# Patient Record
Sex: Male | Born: 1975
Health system: Southern US, Community
[De-identification: ages and names within clinical notes are randomized; demographics above are authoritative.]

---

## 2000-07-10 ENCOUNTER — Emergency Department (HOSPITAL_COMMUNITY): Admission: EM | Admit: 2000-07-10 | Discharge: 2000-07-10 | Payer: Self-pay | Admitting: Emergency Medicine

## 2001-07-04 ENCOUNTER — Encounter: Admission: RE | Admit: 2001-07-04 | Discharge: 2001-07-04 | Payer: Self-pay | Admitting: Family Medicine

## 2014-06-30 ENCOUNTER — Emergency Department (HOSPITAL_BASED_OUTPATIENT_CLINIC_OR_DEPARTMENT_OTHER)
Admission: EM | Admit: 2014-06-30 | Discharge: 2014-06-30 | Disposition: A | Discharge status: Home / Self Care | Payer: BLUE CROSS/BLUE SHIELD | Attending: Emergency Medicine | Admitting: Emergency Medicine

## 2014-06-30 ENCOUNTER — Emergency Department (HOSPITAL_BASED_OUTPATIENT_CLINIC_OR_DEPARTMENT_OTHER): Payer: BLUE CROSS/BLUE SHIELD

## 2014-06-30 ENCOUNTER — Encounter (HOSPITAL_BASED_OUTPATIENT_CLINIC_OR_DEPARTMENT_OTHER): Payer: Self-pay | Admitting: *Deleted

## 2014-06-30 DIAGNOSIS — R109 Unspecified abdominal pain: Secondary | ICD-10-CM

## 2014-06-30 DIAGNOSIS — R197 Diarrhea, unspecified: Secondary | ICD-10-CM | POA: Insufficient documentation

## 2014-06-30 DIAGNOSIS — N23 Unspecified renal colic: Secondary | ICD-10-CM | POA: Insufficient documentation

## 2014-06-30 DIAGNOSIS — N201 Calculus of ureter: Secondary | ICD-10-CM | POA: Insufficient documentation

## 2014-06-30 DIAGNOSIS — R1032 Left lower quadrant pain: Secondary | ICD-10-CM

## 2014-06-30 LAB — COMPREHENSIVE METABOLIC PANEL
ALBUMIN: 4.8 g/dL (ref 3.5–5.2)
ALT: 54 U/L — ABNORMAL HIGH (ref 0–53)
ANION GAP: 6 (ref 5–15)
AST: 43 U/L — AB (ref 0–37)
Alkaline Phosphatase: 69 U/L (ref 39–117)
BUN: 15 mg/dL (ref 6–23)
CALCIUM: 9.1 mg/dL (ref 8.4–10.5)
CO2: 27 mmol/L (ref 19–32)
CREATININE: 1.26 mg/dL (ref 0.50–1.35)
Chloride: 102 mmol/L (ref 96–112)
GFR calc Af Amer: 82 mL/min — ABNORMAL LOW (ref >90)
GFR calc non Af Amer: 71 mL/min — ABNORMAL LOW (ref >90)
Glucose, Bld: 109 mg/dL — ABNORMAL HIGH (ref 70–99)
Potassium: 3.4 mmol/L — ABNORMAL LOW (ref 3.5–5.1)
Sodium: 135 mmol/L (ref 135–145)
Total Bilirubin: 2.4 mg/dL — ABNORMAL HIGH (ref 0.3–1.2)
Total Protein: 8.5 g/dL — ABNORMAL HIGH (ref 6.0–8.3)

## 2014-06-30 LAB — CBC
HCT: 47.6 % (ref 39.0–52.0)
Hemoglobin: 15.6 g/dL (ref 13.0–17.0)
MCH: 30 pg (ref 26.0–34.0)
MCHC: 32.8 g/dL (ref 30.0–36.0)
MCV: 91.5 fL (ref 78.0–100.0)
PLATELETS: 197 10*3/uL (ref 150–400)
RBC: 5.2 MIL/uL (ref 4.22–5.81)
RDW: 13 % (ref 11.5–15.5)
WBC: 5 10*3/uL (ref 4.0–10.5)

## 2014-06-30 LAB — LIPASE, BLOOD: Lipase: 24 U/L (ref 11–59)

## 2014-06-30 MED ORDER — FENTANYL CITRATE 0.05 MG/ML IJ SOLN
50.0000 ug | INTRAMUSCULAR | Status: DC | PRN
  Administered 2014-06-30: 50 ug via INTRAVENOUS
  Filled 2014-06-30: qty 2

## 2014-06-30 MED ORDER — ONDANSETRON 4 MG PO TBDP: ORAL_TABLET | ORAL | Status: DC

## 2014-06-30 MED ORDER — ONDANSETRON HCL 4 MG/2ML IJ SOLN
4.0000 mg | Freq: Once | INTRAMUSCULAR | Status: AC
  Administered 2014-06-30: 4 mg via INTRAVENOUS
  Filled 2014-06-30: qty 2

## 2014-06-30 MED ORDER — OXYCODONE-ACETAMINOPHEN 5-325 MG PO TABS
2.0000 | ORAL_TABLET | Freq: Once | ORAL | Status: AC
  Administered 2014-06-30: 2 via ORAL
  Filled 2014-06-30: qty 2

## 2014-06-30 MED ORDER — OXYCODONE-ACETAMINOPHEN 5-325 MG PO TABS: 1.0000 | ORAL_TABLET | ORAL | Status: DC | PRN

## 2014-06-30 MED ORDER — KETOROLAC TROMETHAMINE 30 MG/ML IJ SOLN
30.0000 mg | Freq: Once | INTRAMUSCULAR | Status: AC
  Administered 2014-06-30: 30 mg via INTRAVENOUS
  Filled 2014-06-30: qty 1

## 2014-06-30 MED ORDER — FENTANYL CITRATE 0.05 MG/ML IJ SOLN: 50.0000 ug | Freq: Once | INTRAMUSCULAR | Status: DC

## 2014-06-30 MED ORDER — FENTANYL CITRATE 0.05 MG/ML IJ SOLN
50.0000 ug | Freq: Once | INTRAMUSCULAR | Status: AC
  Administered 2014-06-30: 50 ug via INTRAVENOUS
  Filled 2014-06-30: qty 2

## 2014-06-30 NOTE — ED Provider Notes (Signed)
CSN: [id number]     Arrival date & time 06/30/14  1249 History   First MD Initiated Contact with Patient 06/30/14 1401     Chief Complaint  Patient presents with  . Flank Pain     (Consider location/radiation/quality/duration/timing/severity/associated sxs/prior Treatment) The history is provided by the patient and medical records. No language interpreter was used.      Kord Monette is a 39 y.o. male  with a hx of ankylosing spondylitis presents to the Emergency Department complaining of gradual, persistent, progressively worsening left flank and left side abd pain onset 8:30am.  Pt reports that he had the a GI virus with Nausea, vomiting and diarrhea for 24 hours with improvement and no vomiting or diarrhea in the last 24 hours.  He reports he arose to get ready for work and the pain began in the shower.  Pt reports his urine is dark but he denies dysuria.  Nothing makes the pain better or worse. Patient denies fever, chills, headache, neck pain, chest pain, shortness of breath, venous, dizziness, syncope, dysuria. Patient also denies testicular pain and penile pain.   History reviewed. No pertinent past medical history. History reviewed. No pertinent past surgical history. No family history on file. History  Substance Use Topics  . Smoking status: Never Smoker   . Smokeless tobacco: Not on file  . Alcohol Use: No    Review of Systems  Constitutional: Negative for fever, diaphoresis, appetite change, fatigue and unexpected weight change.  HENT: Negative for mouth sores.   Eyes: Negative for visual disturbance.  Respiratory: Negative for cough, chest tightness, shortness of breath and wheezing.   Cardiovascular: Negative for chest pain.  Gastrointestinal: Positive for vomiting (resolved), abdominal pain (LLQ abd pain) and diarrhea (resolved). Negative for nausea and constipation.  Endocrine: Negative for polydipsia, polyphagia and polyuria.  Genitourinary: Positive for flank  pain. Negative for dysuria, urgency, frequency and hematuria.  Musculoskeletal: Negative for back pain and neck stiffness.  Skin: Negative for rash.  Allergic/Immunologic: Negative for immunocompromised state.  Neurological: Negative for syncope, light-headedness and headaches.  Hematological: Does not bruise/bleed easily.  Psychiatric/Behavioral: Negative for sleep disturbance. The patient is not nervous/anxious.       Allergies  Review of patient's allergies indicates no known allergies.  Home Medications   Prior to Admission medications   Medication Sig Start Date End Date Taking? Authorizing Provider  ondansetron (ZOFRAN ODT) 4 MG disintegrating tablet 42m ODT q4 hours prn nausea/vomit 06/30/14   Wynne Jury, PA-C  oxyCODONE-acetaminophen (PERCOCET) 5-325 MG per tablet Take 1-2 tablets by mouth every 4 (four) hours as needed. 06/30/14   Jaydyn Bozzo, PA-C   BP 113/66 mmHg  Pulse 68  Temp(Src) 98.5 F (36.9 C) (Oral)  Resp 16  Ht 5' 11"  (1.803 m)  Wt 186 lb (84.369 kg)  BMI 25.95 kg/m2  SpO2 100% Physical Exam  Constitutional: He appears well-developed and well-nourished. No distress.  HENT:  Head: Normocephalic and atraumatic.  Mouth/Throat: Oropharynx is clear and moist. No oropharyngeal exudate.  Cardiovascular: Normal rate, regular rhythm, normal heart sounds and intact distal pulses.   Pulmonary/Chest: Effort normal and breath sounds normal. No respiratory distress. He has no wheezes.  Abdominal: Soft. Bowel sounds are normal. He exhibits no distension and no mass. There is tenderness (LLQ and left side) in the left lower quadrant. There is no rebound, no guarding and no CVA tenderness. Hernia confirmed negative in the right inguinal area and confirmed negative in the left inguinal area.  Mild tenderness to palpation of the left side and left lower quadrant without guarding or rebound No CVA tenderness  Genitourinary: Testes normal and penis normal.  Cremasteric reflex is present. Right testis shows no mass, no swelling and no tenderness. Right testis is descended. Cremasteric reflex is not absent on the right side. Left testis shows no mass, no swelling and no tenderness. Left testis is descended. Cremasteric reflex is not absent on the left side. Circumcised. No phimosis, paraphimosis, hypospadias, penile erythema or penile tenderness. No discharge found.  Musculoskeletal: Normal range of motion. He exhibits no edema.  Lymphadenopathy:       Right: No inguinal adenopathy present.       Left: No inguinal adenopathy present.  Neurological: He is alert.  Skin: Skin is warm and dry. No rash noted. He is not diaphoretic.  Psychiatric: He has a normal mood and affect.  Nursing note and vitals reviewed.   ED Course  Procedures (including critical care time) Labs Review Labs Reviewed  COMPREHENSIVE METABOLIC PANEL - Abnormal; Notable for the following:    Potassium 3.4 (*)    Glucose, Bld 109 (*)    Total Protein 8.5 (*)    AST 43 (*)    ALT 54 (*)    Total Bilirubin 2.4 (*)    GFR calc non Af Amer 71 (*)    GFR calc Af Amer 82 (*)    All other components within normal limits  CBC  LIPASE, BLOOD    Imaging Review Ct Renal Stone Study  06/30/2014   CLINICAL DATA:  Left flank pain since yesterday.  EXAM: CT ABDOMEN AND PELVIS WITHOUT CONTRAST  TECHNIQUE: Multidetector CT imaging of the abdomen and pelvis was performed following the standard protocol without IV contrast.  COMPARISON:  None.  FINDINGS: Lung bases are clear.  No effusions.  Heart is normal size.  3 mm stone noted at the left UPJ with from mild left hydronephrosis. No additional ureteral stones or renal stones. No hydronephrosis on the right.  Liver, gallbladder, spleen, pancreas and adrenals have an unremarkable unenhanced appearance.  Appendix is visualized and is normal. Stomach, large and small bowel unremarkable. Urinary bladder and prostate unremarkable. No free fluid,  free air or adenopathy. Aorta is normal caliber.  No acute bony abnormality or focal bone lesion.  IMPRESSION: 3 mm left UPJ stone.  Mild left hydronephrosis.   Electronically Signed   By: Rolm Baptise M.D.   On: 06/30/2014 15:24     EKG Interpretation None      MDM   Final diagnoses:  Left flank pain  LLQ abdominal pain  Left ureteral stone  Renal colic on left side   Hasheem Voland presents to emergency department with left flank and left lower quadrant abdominal pain. Patient with recent viral gastroenteritis however based on patient's symptoms and physical exam I doubt that this is a complication of the gastroenteritis and more likely to be renal colic. Patient's pain well controlled with Tylenol. Will obtain CT renal study and labs.   4:24pm CT with 3 mm left UPJ stone with mild left hydronephrosis consistent with patient's symptoms. His labs are reassuring. Mild elevation in AST and ALTs with a normal lipase. No upper abdominal pain. No Murphy's sign and no right upper quadrant tenderness.  Patient with recent viral gastroenteritis likely the cause of his elevated AST and ALTs. Otherwise labs reassuring. Patient is resting comfortably this time. He reports his pain is under control and wishes for discharge home.  Discussed potential complications of kidney stones as patient has no history of same. He will return to the emergency department for fevers, chills, intractable vomiting or intractable pain. He is to follow-up with urology as needed.  I have personally reviewed patient's vitals, nursing note and any pertinent labs or imaging.  I performed an focused physical exam; undressed when appropriate .    It has been determined that no acute conditions requiring further emergency intervention are present at this time. The patient/guardian have been advised of the diagnosis and plan. I reviewed any labs and imaging including any potential incidental findings. We have discussed signs and  symptoms that warrant return to the ED and they are listed in the discharge instructions.    Vital signs are stable at discharge.   BP 113/66 mmHg  Pulse 68  Temp(Src) 98.5 F (36.9 C) (Oral)  Resp 16  Ht 5' 11"  (1.803 m)  Wt 186 lb (84.369 kg)  BMI 25.95 kg/m2  SpO2 100%        Abigail Butts, PA-C 06/30/14 [place], MD 07/01/14 [phone]

## 2014-06-30 NOTE — Discharge Instructions (Signed)
1. Medications: percocet, zofran, usual home medications 2. Treatment: rest, drink plenty of fluids, you may take 854m of ibuprofen with food up to 3 times per day with the percocet, but no extra tylenol 3. Follow Up: Please followup with your primary doctor in 1 week for discussion of your diagnoses and further evaluation after today's visit; if you do not have a primary care doctor use the resource guide provided to find one; Please return to the ER for intractable vomiting, persistent pain or other concerns. Please also follow-up with the urologist listed.  Kidney Stones Kidney stones (urolithiasis) are deposits that form inside your kidneys. The intense pain is caused by the stone moving through the urinary tract. When the stone moves, the ureter goes into spasm around the stone. The stone is usually passed in the urine.  CAUSES   A disorder that makes certain neck glands produce too much parathyroid hormone (primary hyperparathyroidism).  A buildup of uric acid crystals, similar to gout in your joints.  Narrowing (stricture) of the ureter.  A kidney obstruction present at birth (congenital obstruction).  Previous surgery on the kidney or ureters.  Numerous kidney infections. SYMPTOMS   Feeling sick to your stomach (nauseous).  Throwing up (vomiting).  Blood in the urine (hematuria).  Pain that usually spreads (radiates) to the groin.  Frequency or urgency of urination. DIAGNOSIS   Taking a history and physical exam.  Blood or urine tests.  CT scan.  Occasionally, an examination of the inside of the urinary bladder (cystoscopy) is performed. TREATMENT   Observation.  Increasing your fluid intake.  Extracorporeal shock wave lithotripsy--This is a noninvasive procedure that uses shock waves to break up kidney stones.  Surgery may be needed if you have severe pain or persistent obstruction. There are various surgical procedures. Most of the procedures are performed  with the use of small instruments. Only small incisions are needed to accommodate these instruments, so recovery time is minimized. The size, location, and chemical composition are all important variables that will determine the proper choice of action for you. Talk to your health care provider to better understand your situation so that you will minimize the risk of injury to yourself and your kidney.  HOME CARE INSTRUCTIONS   Drink enough water and fluids to keep your urine clear or pale yellow. This will help you to pass the stone or stone fragments.  Strain all urine through the provided strainer. Keep all particulate matter and stones for your health care provider to see. The stone causing the pain may be as small as a grain of salt. It is very important to use the strainer each and every time you pass your urine. The collection of your stone will allow your health care provider to analyze it and verify that a stone has actually passed. The stone analysis will often identify what you can do to reduce the incidence of recurrences.  Only take over-the-counter or prescription medicines for pain, discomfort, or fever as directed by your health care provider.  Make a follow-up appointment with your health care provider as directed.  Get follow-up X-rays if required. The absence of pain does not always mean that the stone has passed. It may have only stopped moving. If the urine remains completely obstructed, it can cause loss of kidney function or even complete destruction of the kidney. It is your responsibility to make sure X-rays and follow-ups are completed. Ultrasounds of the kidney can show blockages and the status of the  kidney. Ultrasounds are not associated with any radiation and can be performed easily in a matter of minutes. SEEK MEDICAL CARE IF:  You experience pain that is progressive and unresponsive to any pain medicine you have been prescribed. SEEK IMMEDIATE MEDICAL CARE IF:   Pain  cannot be controlled with the prescribed medicine.  You have a fever or shaking chills.  The severity or intensity of pain increases over 18 hours and is not relieved by pain medicine.  You develop a new onset of abdominal pain.  You feel faint or pass out.  You are unable to urinate. MAKE SURE YOU:   Understand these instructions.  Will watch your condition.  Will get help right away if you are not doing well or get worse. Document Released: 05/07/2005 Document Revised: 01/07/2013 Document Reviewed: 10/08/2012 [facility] Patient Information 2015 [place], [place]. This information is not intended to replace advice given to you by your health care provider. Make sure you discuss any questions you have with your health care provider.    Emergency Department Resource Guide 1) Find a Doctor and Pay Out of Pocket Although you won't have to find out who is covered by your insurance plan, it is a good idea to ask around and get recommendations. You will then need to call the office and see if the doctor you have chosen will accept you as a new patient and what types of options they offer for patients who are self-pay. Some doctors offer discounts or will set up payment plans for their patients who do not have insurance, but you will need to ask so you aren't surprised when you get to your appointment.  2) Contact Your Local Health Department Not all health departments have doctors that can see patients for sick visits, but many do, so it is worth a call to see if yours does. If you don't know where your local health department is, you can check in your phone book. The CDC also has a tool to help you locate your state's health department, and many state websites also have listings of all of their local health departments.  3) Find a [facility] If your illness is not likely to be very severe or complicated, you may want to try a walk in clinic. These are popping up all over the country in  pharmacies, drugstores, and shopping centers. They're usually staffed by nurse practitioners or physician assistants that have been trained to treat common illnesses and complaints. They're usually fairly quick and inexpensive. However, if you have serious medical issues or chronic medical problems, these are probably not your best option.  No Primary Care Doctor: - Call Health Connect at  [phone] - they can help you locate a primary care doctor that  accepts your insurance, provides certain services, etc. - Physician Referral Service- [phone]  Chronic Pain Problems: Organization         Address  Phone   Notes  [facility]  [phone] Patients need to be referred by their primary care doctor.   Medication Assistance: Organization         Address  Phone   Notes  [facility] Medication [facility] [address] [place], [place] [address] [phone] --Must be a resident of [facility] -- Must have NO insurance coverage whatsoever (no Medicaid/ Medicare, etc.) -- The pt. MUST have a primary care doctor that directs their care regularly and follows them in the community   MedAssist  (  [phone]   [employer]  [phone]    Agencies that provide inexpensive medical care: Organization         Address  Phone   Notes  [place]  [phone]   [facility]    [phone]   [facility] [place], [place] [address] [phone]   [place] [address] [place] [phone]   Planned Parenthood    [phone]   [facility]    [phone]   [place] and [address], [place] Phone:  [phone], Fax:  [phone] Hours of Operation:  9 am - 6 pm, M-F.  Also accepts Medicaid/Medicare and self-pay.  [facility] for [place] [address],  [place] Phone: [phone], Fax: [phone]. Hours of Operation:  8:30 am - 5:30 pm, M-F.  Also accepts Medicaid and self-pay.  [facility] [address], [place] Phone: [phone]   [place], [place], [place] [phone], Ext. [phone] Mondays & Thursdays: 7-9 AM.  First 15 patients are seen on a first come, first serve basis.    [place] Providers:  Organization         Address  Phone   Notes  [facility] [address], [place] [phone] Also accepts self-pay patients.  [facility] [address], [place]  [phone]   [place], [address], [place] [phone]   [facility] [address], [place] [phone]   Lucianne Lei [address], [place]   [phone] Only accepts [place] Access [place] patients after they have their name applied to their card.   Self-Pay (no insurance) in [facility]:  Organization         Address  Phone   Notes  Sickle Cell Patients, [facility] Internal Medicine [place] [phone]   [facility] [place] [phone]   [facility] [place]  [place], [place], [place] [phone]   [facility]/Dr. Osei-Bonsu  [address], [place] or [address], [place] [phone] Phone number for both [place] and [place] locations is the same.  [facility]gent [facility] and [facility] [address], [place] [phone]   [facility] [address], [place] or [address] [phone]   [facility] [address], [place] [phone], phone; [phone], fax Sees patients 1st and 3rd Saturday of every month.  Must not  qualify for public or private insurance (i.e. Medicaid, Medicare, [place] Health Choice, Veterans' Benefits)  Household income should be no more than 200% of the poverty level The clinic cannot treat you if you are pregnant or think you are pregnant  Sexually transmitted diseases are not treated at the clinic.    Dental Care: Organization         Address  Phone  Notes  [facility] Department of [facility] [place] [phone] Accepts children up to age 82 who are enrolled in [place] or [place]; pregnant women  with a Medicaid card; and children who have applied for Medicaid or [place] Health Choice, but were declined, whose parents can pay a reduced fee at time of service.  [facility] Department of [facility]  [address], [place] [phone] Accepts children up to age 56 who are enrolled in [place] or [place]; pregnant women with a Medicaid card; and children who have applied for Medicaid or [place] Health Choice, but were declined, whose parents can pay a reduced fee at time of service.  [place] Adult Dental Access PROGRAM  [place] [phone] Patients are seen by appointment only. Walk-ins are not accepted. [place] will see patients 81 years of age and older. Monday - Tuesday (8am-5pm) Most Wednesdays (8:30-5pm) $30 per visit, cash only  [facility] Adult Dental Access PROGRAM  [address], [facility] [phone] Patients are seen by appointment only. Walk-ins are not accepted. [place] will see patients 63 years of age and older. One Wednesday Evening (Monthly: Volunteer Based).  $30 per visit, cash only  [place]  [phone] for adults; Children under age 50, call Graduate Pediatric Dentistry at [phone]. Children aged 36-14, please call [phone] to request a pediatric application.  Dental services are provided  in all areas of dental care including fillings, crowns and bridges, complete and partial dentures, implants, gum treatment, root canals, and extractions. Preventive care is also provided. Treatment is provided to both adults and children. Patients are selected via a lottery and there is often a waiting list.   [facility] [address], [place]  [phone] [email or web address]   [facility] [address] [place], [place] [phone], Ext. [phone] Second and Fourth Thursday of each month, opens at 6:30 AM; Clinic ends at 9 AM.  Patients are seen on a first-come first-served basis, and a limited number are seen during each clinic.   [facility]  [address] [place] D[place]er [place], [place] [phone]   Eligibility Requirements You must have lived in [place], [place], or [place] counties for at least the last three months.   You cannot be eligible for state or federal sponsored [employer], including [employer], [place], or [employer].   You generally cannot be eligible for healthcare insurance through your employer.    How to apply: Eligibility screenings are held every Tuesday and Wednesday afternoon from 1:00 pm until 4:00 pm. You do not need an appointment for the interview!  [facility] [address], [place], [place]   [place] Department  [place]  [phone]    Behavioral Health Resources in the Community: Intensive Outpatient Programs Organization         Address  Phone  Notes  [place] Edgar. [address], [place], [place] [phone]   [facility] Outpatient [address], [place], [place]   ADS: Alcohol & Drug S[facility]cs [address] [place], [place]   [place] [address],  [place], [place] or [phone]   Substance Abuse Resources Organization         Address  Phone  Notes  Alcohol and Drug Services  [phone]   [facility]  [phone]   [facility]   [facility]  [phone]   Residential & Outpatient Substance Abuse Program  [phone]   Psychological Services Organization         Address  Phone  Notes  [facility] [place]  [place]  [phone]   [place] [address], [place] or [phone]    Mobile Crisis Teams Organization         Address  Phone  Notes  [facility], Mobile Crisis Care Unit  [phone]   [facility]  [address] [place], [place]   [place] [address] [place] [phone]    Self-Help/Support Groups Organization         Address  Phone             Notes  [place]. of [place] - variety of support groups  [place] Call for more information  Narcotics Anonymous (NA), [facility] [address], [employer] [place]  2 meetings at this location   Special educational needs teacher         Address  Phone  Notes  [facility] Residential Treatment [place],    [place]  [phone]   [facility]  [address] [place] [phone], [place], [place]   [place], [place] [phone] Admissions: 8am-3pm M-F  Incentives Substance [place] [address],    [place], [place] [phone]   [facility] [address] [place], [place], [place]   [facility] (2-Rh) [address],  [place], [place]   [facility] Programs - Intensive Outpatient [address] Kristeen [address], [place], [place]   [facility] (Rew.) [place].,  [place], [place] [phone] or  [phone]   [facility]idential Treatment Services (RTS) [address] [place], [place] Accepts Medicaid  [facility] [place] [address]  [place] [phone] Substance Abuse/Addiction Treatment   [facility] Organization         Address  Phone  Notes  [facility]  [phone]   Domenic Schwab, PhD [address] Arlis Porta [place], [place]   [phone] or [phone]   [place], [place] [phone]   [facility] [address], [place], [place] [phone] Insurance/Medicaid/sponsorship through [facility] and Families [address] [place]                                    [place], [place] [phone] [place] [address], [place] [phone]    Dr. Adele Schilder  [phone]   [facility] Dept. 1) [address] [place] 2) [place] 3)  [place] [address], [place] [phone]  [phone]   [place] [phone] or [phone] (After Hours)

## 2014-06-30 NOTE — ED Notes (Signed)
Pt pacing the floor, sts that pain started this morning. Reports recent GI bug with nausea and vomiting.

## 2014-06-30 NOTE — ED Notes (Signed)
Pt c/o left flank pain radiating around to left abd. He sts urine is darker in color than normal.

## 2014-06-30 NOTE — ED Notes (Signed)
MD at bedside. 

## 2014-09-13 ENCOUNTER — Other Ambulatory Visit: Payer: Self-pay | Admitting: Rheumatology

## 2014-09-13 ENCOUNTER — Ambulatory Visit
Admission: RE | Admit: 2014-09-13 | Discharge: 2014-09-13 | Disposition: A | Discharge status: Home / Self Care | Payer: BLUE CROSS/BLUE SHIELD | Source: Ambulatory Visit | Attending: Rheumatology | Admitting: Rheumatology

## 2014-09-13 ENCOUNTER — Other Ambulatory Visit: Payer: BLUE CROSS/BLUE SHIELD

## 2014-09-13 DIAGNOSIS — M545 Low back pain: Secondary | ICD-10-CM

## 2014-09-13 DIAGNOSIS — M542 Cervicalgia: Principal | ICD-10-CM

## 2014-09-13 DIAGNOSIS — G8929 Other chronic pain: Secondary | ICD-10-CM

## 2016-02-10 IMAGING — CR DG CERVICAL SPINE COMPLETE 4+V
5 series · 5 of 5 positions shown · non-contrast
Comparison: None.

CLINICAL DATA: Chronic neck pain, worsening. No known injury.
Initial encounter.

EXAM:
CERVICAL SPINE  4+ VIEWS

[w c-spine lat]
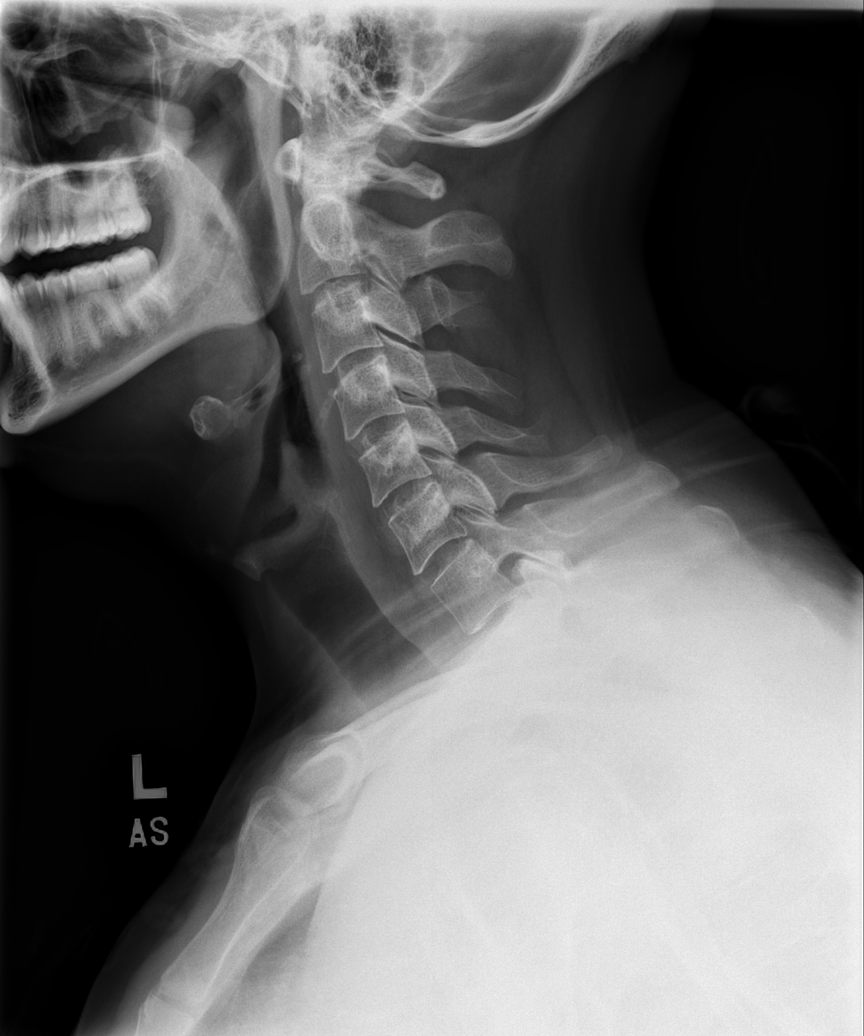

[w c-spine oblique (1 of 2)]
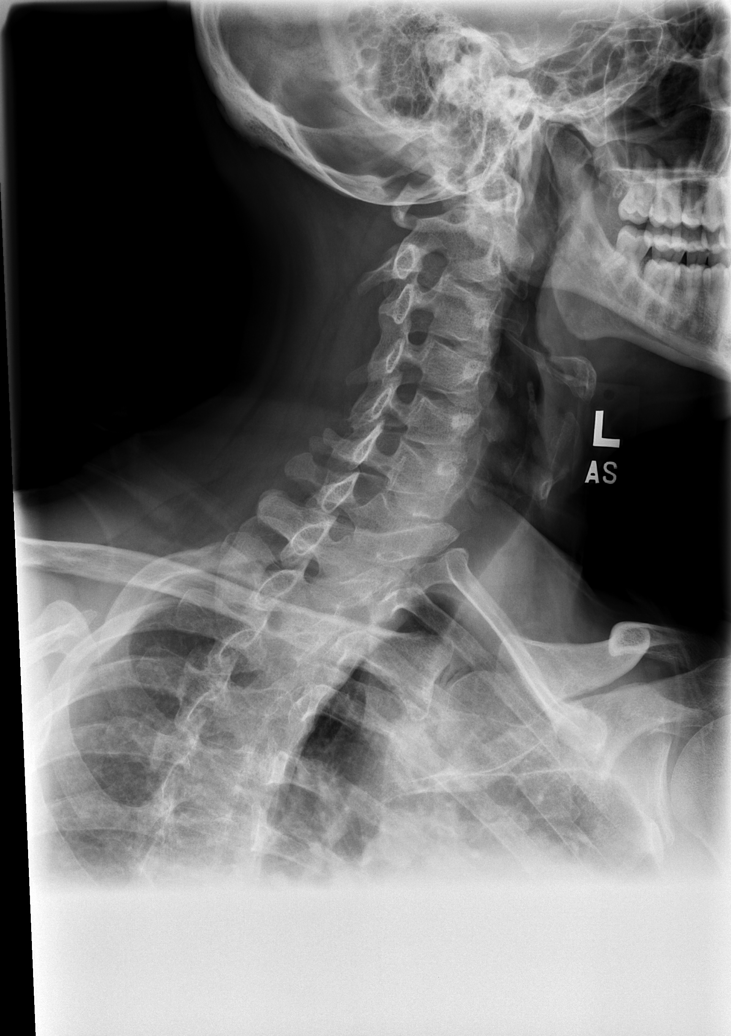

[w c-spine oblique (2 of 2)]
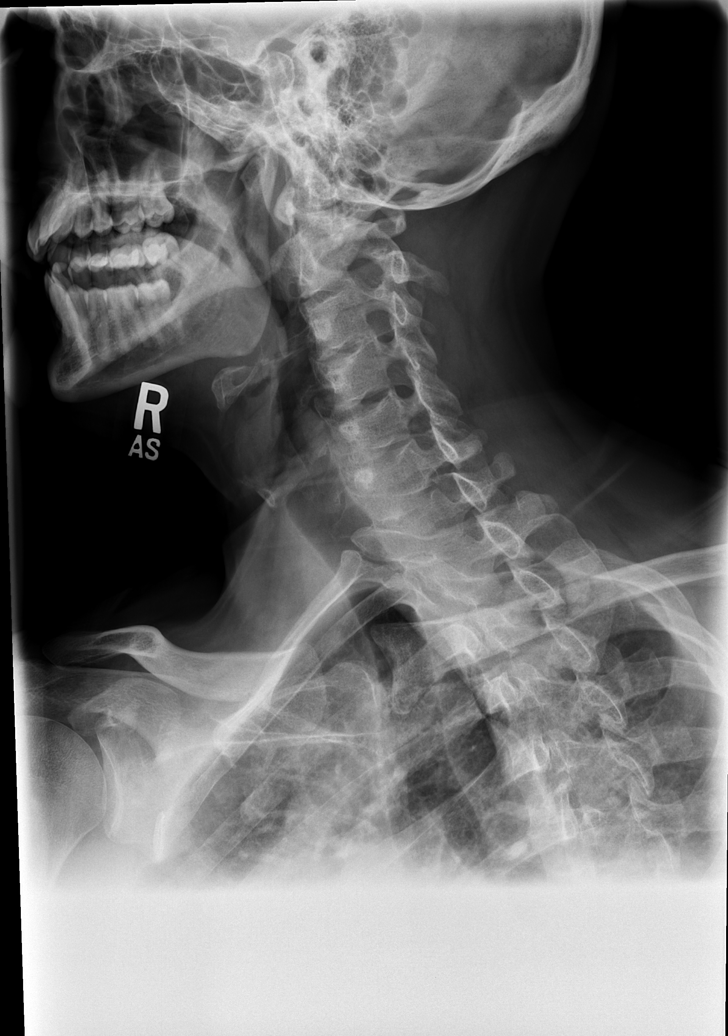

[w c-spine a.p. *]
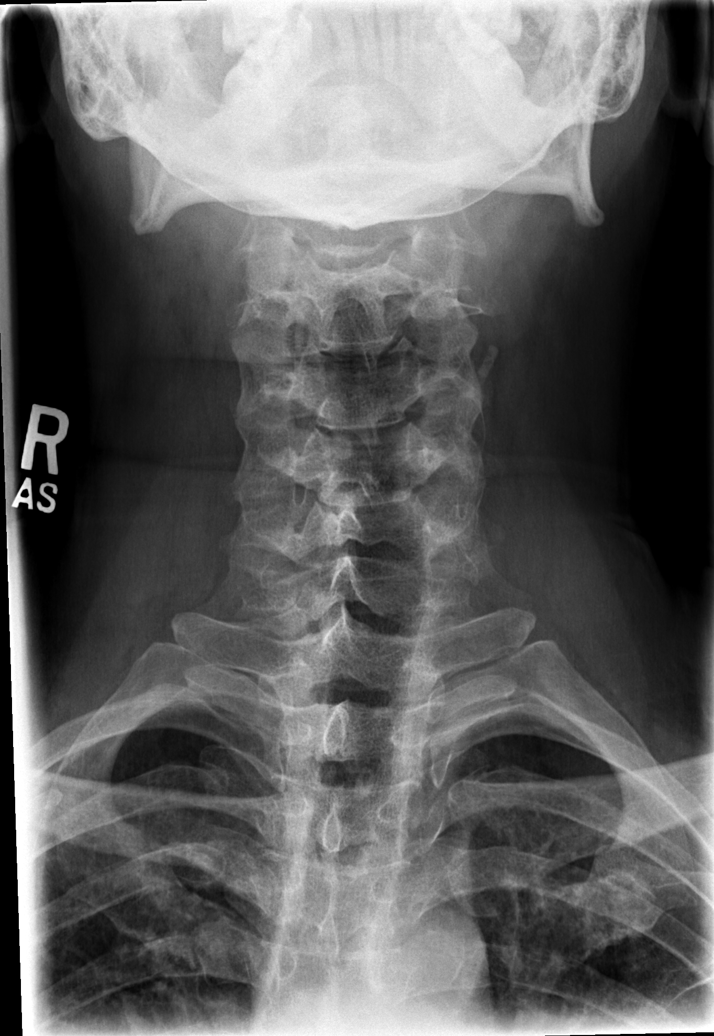

[w c-spine odontoid *]
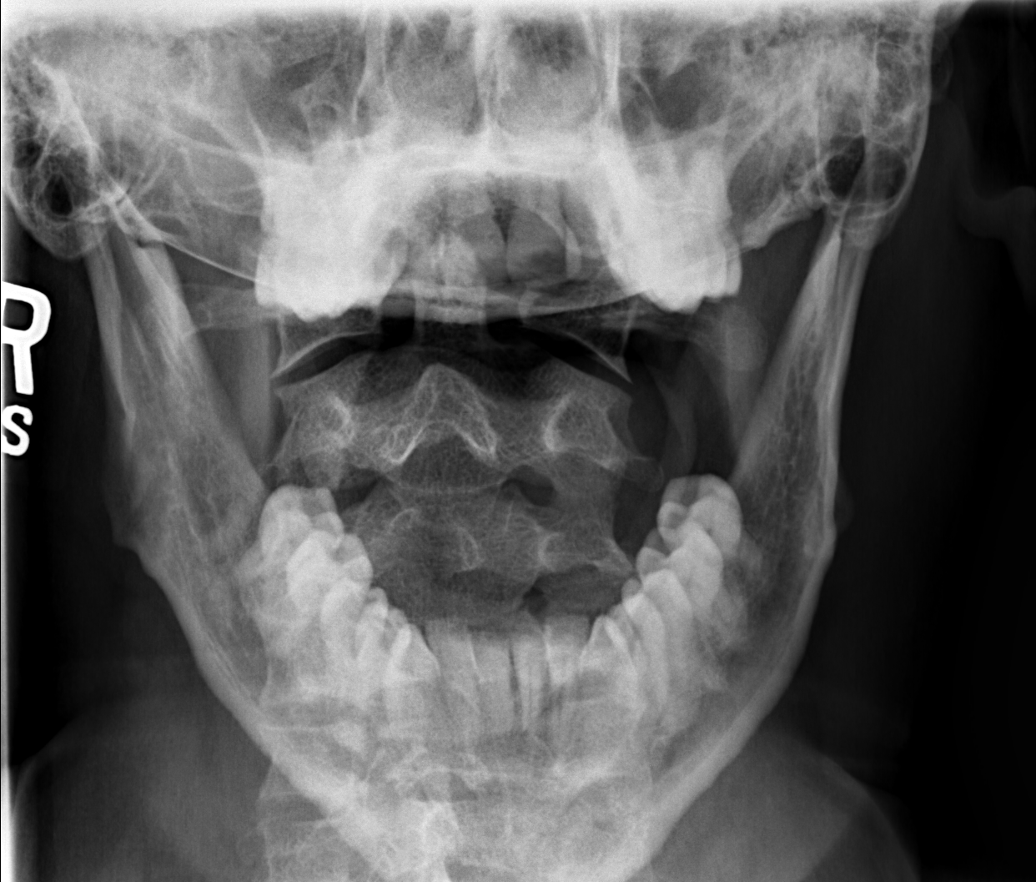

[5 of 5 positions shown; findings below may reference images not displayed]

FINDINGS: There is no evidence of cervical spine fracture or prevertebral soft
tissue swelling. Alignment is normal. No other significant bone
abnormalities are identified.
IMPRESSION: Negative cervical spine radiographs.

## 2016-02-10 IMAGING — CR DG LUMBAR SPINE COMPLETE 4+V
5 series · 5 of 5 positions shown · non-contrast
Comparison: None.

CLINICAL DATA: Low back pain. Now increasing. Reported diagnosis of
ankylosing spondylitis.

EXAM:
LUMBAR SPINE - COMPLETE 4+ VIEW

[t l-spine a.p.]
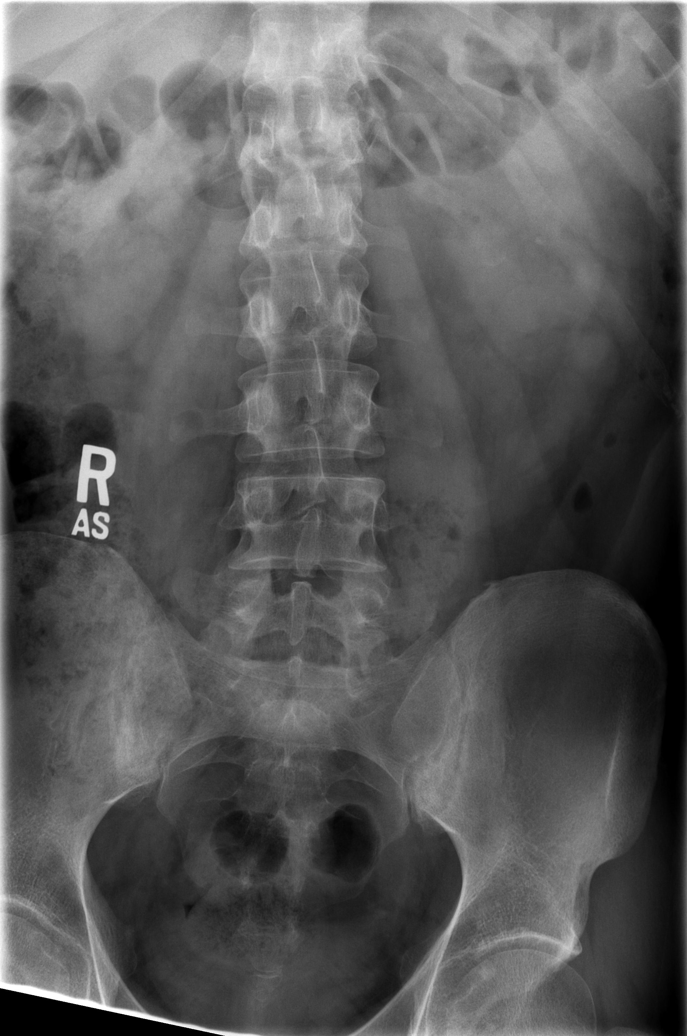

[t l-spine oblique exposure (1 of 2)]
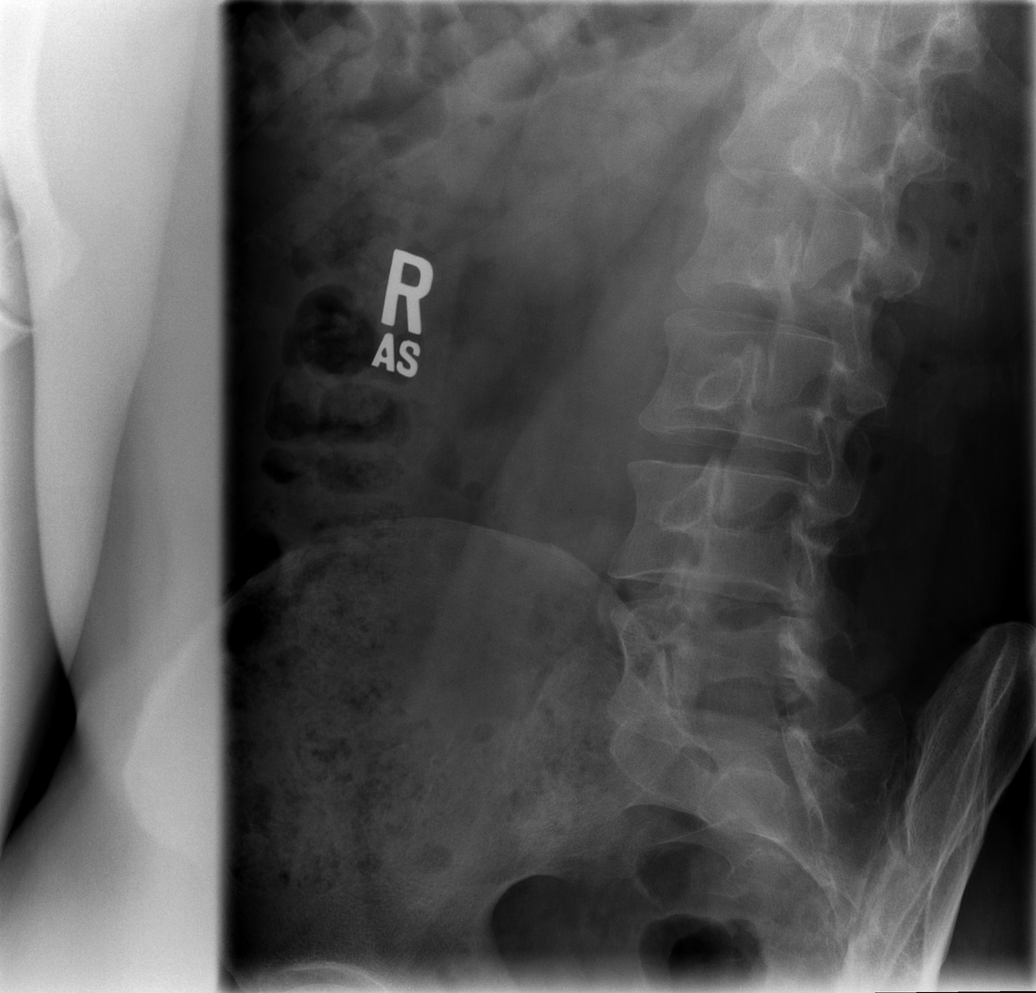

[t l-spine oblique exposure (2 of 2)]
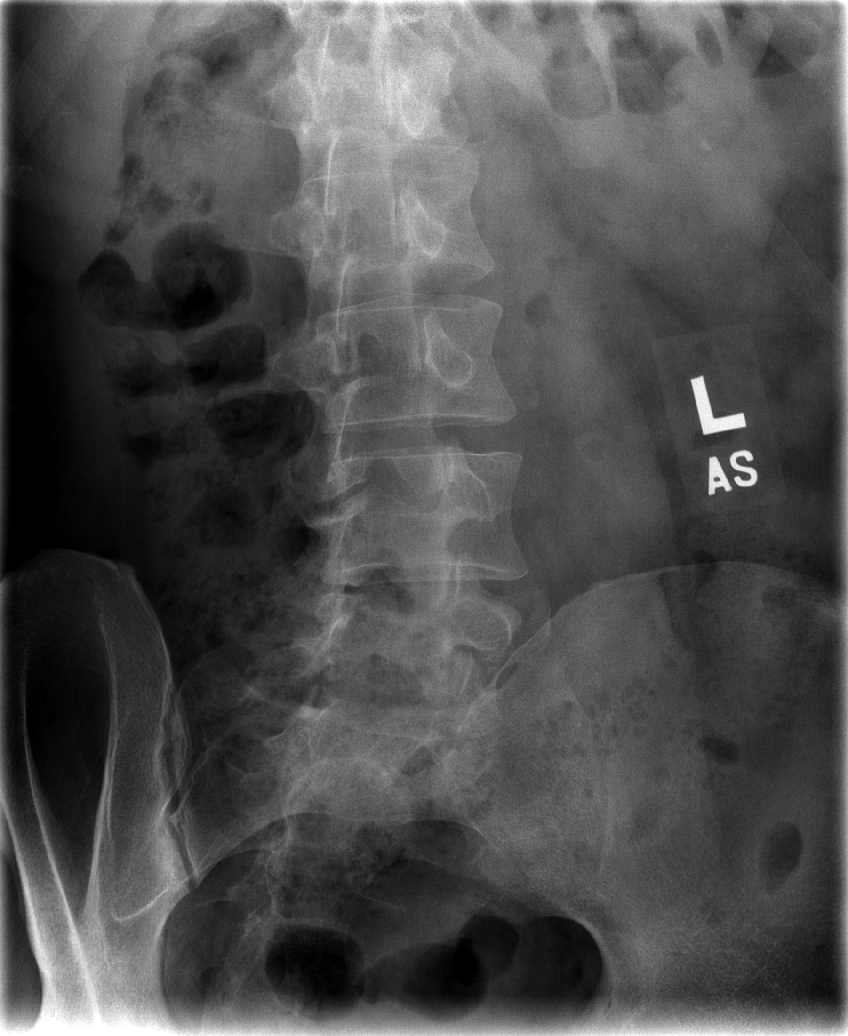

[t l-spine lat]
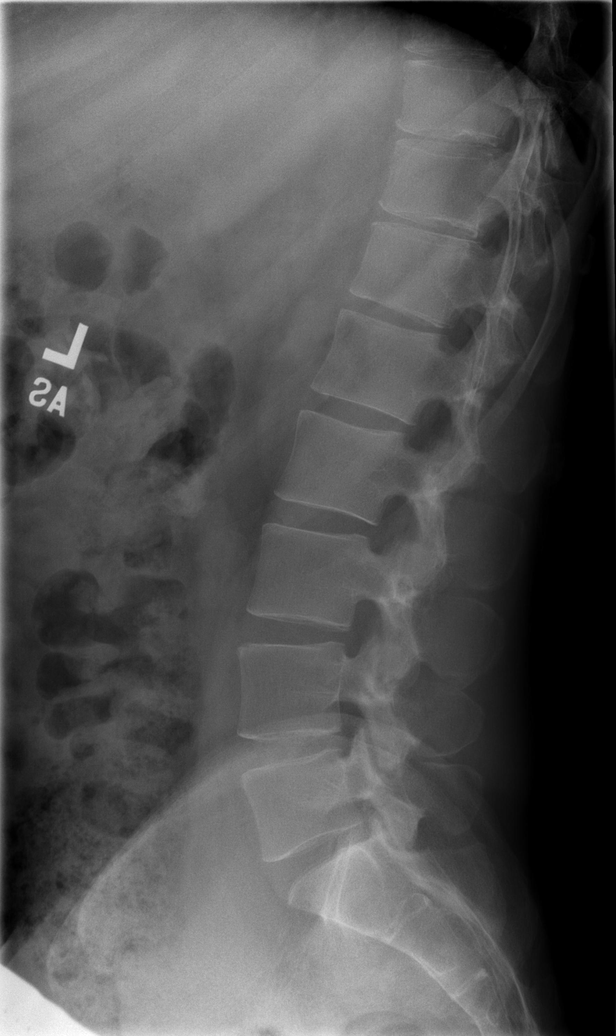

[t l-spine l5-s1 spot]
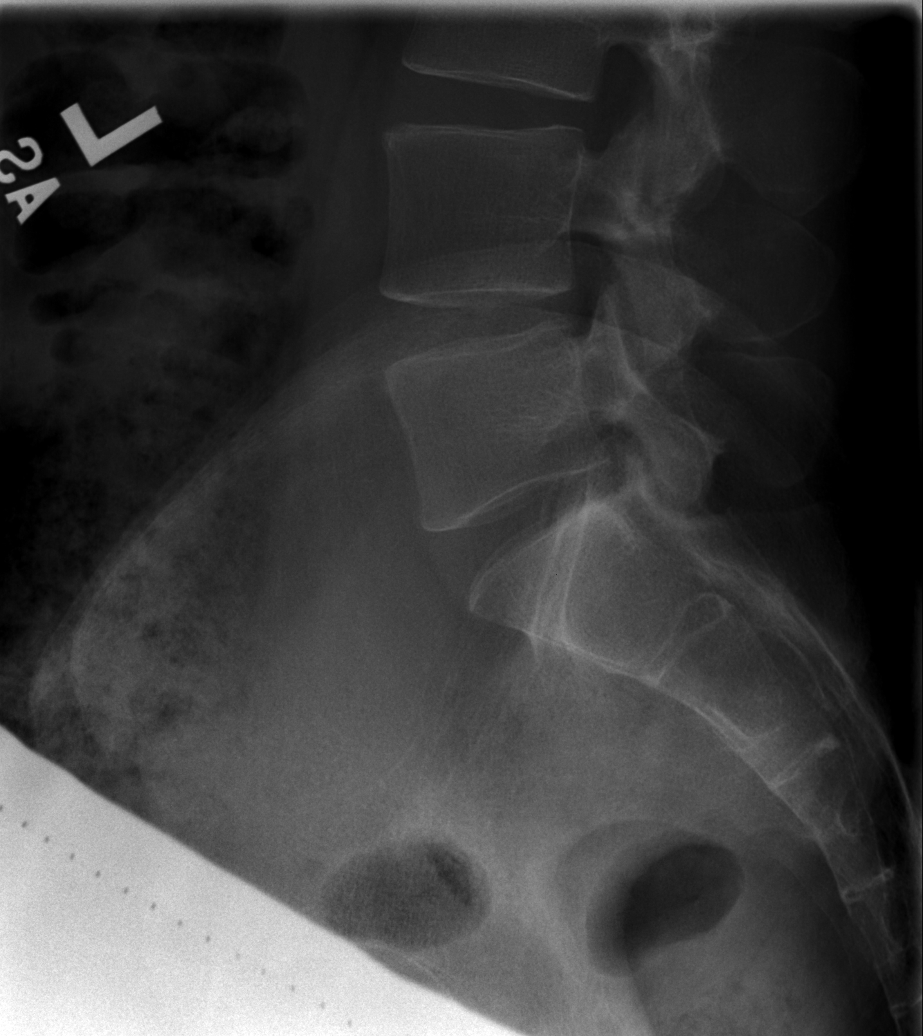

[5 of 5 positions shown; findings below may reference images not displayed]

FINDINGS: There is no evidence of lumbar spine fracture. Alignment is normal.
Intervertebral disc spaces are maintained.

The sacroiliac joints display slight widening hand sclerotic change
particularly on the iliac side of the joints without fusion. There
is no evidence for ossification of the interspinous ligaments. No
marginal syndesmophytes. No reactive sclerosis is seen at the
anterior corners of the vertebral bodies except for possibly the
anterior superior margin of L4 ; This is a soft finding.
IMPRESSION: No significant lumbar findings to suggest advanced ankylosing
spondylitis.

Sclerosis and widening of the BILATERAL SI joints without visible
fusion.

## 2017-07-01 ENCOUNTER — Other Ambulatory Visit: Payer: Self-pay | Admitting: Family Medicine

## 2017-07-01 DIAGNOSIS — R748 Abnormal levels of other serum enzymes: Secondary | ICD-10-CM

## 2017-07-02 ENCOUNTER — Ambulatory Visit
Admission: RE | Admit: 2017-07-02 | Discharge: 2017-07-02 | Disposition: A | Discharge status: Home / Self Care | Payer: BLUE CROSS/BLUE SHIELD | Source: Ambulatory Visit | Attending: Family Medicine | Admitting: Family Medicine

## 2017-07-02 DIAGNOSIS — R748 Abnormal levels of other serum enzymes: Secondary | ICD-10-CM

## 2017-11-28 ENCOUNTER — Other Ambulatory Visit: Payer: Self-pay | Admitting: Nurse Practitioner

## 2017-11-28 DIAGNOSIS — K7581 Nonalcoholic steatohepatitis (NASH): Secondary | ICD-10-CM

## 2017-12-04 ENCOUNTER — Other Ambulatory Visit: Payer: Self-pay | Admitting: Chiropractic Medicine

## 2017-12-04 ENCOUNTER — Ambulatory Visit
Admission: RE | Admit: 2017-12-04 | Discharge: 2017-12-04 | Disposition: A | Discharge status: Home / Self Care | Payer: BLUE CROSS/BLUE SHIELD | Source: Ambulatory Visit | Attending: Chiropractic Medicine | Admitting: Chiropractic Medicine

## 2017-12-04 DIAGNOSIS — M459 Ankylosing spondylitis of unspecified sites in spine: Secondary | ICD-10-CM

## 2017-12-05 ENCOUNTER — Other Ambulatory Visit: Payer: Self-pay | Admitting: Nurse Practitioner

## 2017-12-05 DIAGNOSIS — K7581 Nonalcoholic steatohepatitis (NASH): Secondary | ICD-10-CM

## 2017-12-06 ENCOUNTER — Inpatient Hospital Stay
Admission: RE | Admit: 2017-12-06 | Discharge: 2017-12-06 | Disposition: A | Discharge status: Home / Self Care | Payer: BLUE CROSS/BLUE SHIELD | Source: Ambulatory Visit | Attending: Nurse Practitioner | Admitting: Nurse Practitioner

## 2017-12-16 ENCOUNTER — Ambulatory Visit
Admission: RE | Admit: 2017-12-16 | Discharge: 2017-12-16 | Disposition: A | Discharge status: Home / Self Care | Payer: BLUE CROSS/BLUE SHIELD | Source: Ambulatory Visit | Attending: Nurse Practitioner | Admitting: Nurse Practitioner

## 2017-12-16 DIAGNOSIS — K7581 Nonalcoholic steatohepatitis (NASH): Secondary | ICD-10-CM

## 2018-06-26 ENCOUNTER — Encounter: Payer: Self-pay | Admitting: Cardiovascular Disease

## 2019-01-30 ENCOUNTER — Other Ambulatory Visit: Payer: Self-pay | Admitting: Family Medicine

## 2019-01-30 ENCOUNTER — Ambulatory Visit
Admission: RE | Admit: 2019-01-30 | Discharge: 2019-01-30 | Disposition: A | Discharge status: Home / Self Care | Payer: BLUE CROSS/BLUE SHIELD | Source: Ambulatory Visit | Attending: Family Medicine | Admitting: Family Medicine

## 2019-01-30 DIAGNOSIS — R079 Chest pain, unspecified: Secondary | ICD-10-CM

## 2019-02-11 ENCOUNTER — Ambulatory Visit (INDEPENDENT_AMBULATORY_CARE_PROVIDER_SITE_OTHER): Payer: BC Managed Care – PPO | Admitting: Cardiovascular Disease

## 2019-02-11 ENCOUNTER — Other Ambulatory Visit: Payer: Self-pay

## 2019-02-11 ENCOUNTER — Encounter: Payer: Self-pay | Admitting: Cardiovascular Disease

## 2019-02-11 DIAGNOSIS — R079 Chest pain, unspecified: Secondary | ICD-10-CM

## 2019-02-11 DIAGNOSIS — R0789 Other chest pain: Secondary | ICD-10-CM

## 2019-02-11 MED ORDER — METOPROLOL TARTRATE 100 MG PO TABS: 100.0000 mg | ORAL_TABLET | Freq: Once | ORAL | 0 refills | Status: AC

## 2019-02-11 NOTE — Patient Instructions (Signed)
  Your cardiac CT will be scheduled at one of the below locations:   [facility] [address] [place], [place] [address] (336) [place] [address] [place], [place] [address] [phone]  If scheduled at [facility], please arrive at the [facility] main entrance of [facility] 30-45 minutes prior to test start time. Proceed to the [facility] Radiology Department (first floor) to check-in and test prep.  If scheduled at [facility], please arrive 15 mins early for check-in and test prep.  Please follow these instructions carefully (unless otherwise directed):  Lab work: Your physician recommends that you return for lab work 3-7 [place] ([place]; COMPLETE BLOOD COUNT)  If you have labs (blood work) drawn today and your tests are completely normal, you will receive your results only by: Marland Kitchen MyChart Message (if you have MyChart) OR . A paper copy in the mail If you have any lab test that is abnormal or we need to change your treatment, we will call you to review the results.   Hold all erectile dysfunction medications at least 3 days (72 hrs) prior to test.  On the Night Before the Test: . Be sure to Drink plenty of water. . Do not consume any caffeinated/decaffeinated beverages or chocolate 12 hours prior to your test. . Do not take any antihistamines 12 hours prior to your test.  On the Day of the Test: . Drink plenty of water. Do not drink any water within one hour of the test. . Do not eat any food 4 hours prior to the test. . You may take your regular medications prior to the test.  . Take metoprolol (Lopressor) 100 MG two hours prior to test. .    *For Clinical Staff only. Please instruct patient the following:*          After the Test: . Drink plenty of water. . After receiving IV contrast, you may  experience a mild flushed feeling. This is normal. . On occasion, you may experience a mild rash up to 24 hours after the test. This is not dangerous. If this occurs, you can take Benadryl 25 mg and increase your fluid intake. . If you experience trouble breathing, this can be serious. If it is severe call 911 IMMEDIATELY. If it is mild, please call our office. . If you take any of these medications: Glipizide/Metformin, Avandament, Glucavance, please do not take 48 hours after completing test unless otherwise instructed.    Please contact the cardiac imaging nurse navigator should you have any questions/concerns Marchia Bond, RN Navigator Cardiac Imaging [facility] [phone] Office  [phone] Cell     Follow-Up: At [facility], you and your health needs are our priority.  As part of our continuing mission to provide you with exceptional heart care, we have created designated Provider Care Teams.  These Care Teams include your primary Cardiologist (physician) and Advanced Practice Providers (APPs -  Physician Assistants and Nurse Practitioners) who all work together to provide you with the care you need, when you need it. You MAY SCHEDULE a follow up appointment with Dr. Quay Burow AS NEEDED unless your results are abnormal.

## 2019-02-11 NOTE — Progress Notes (Signed)
02/11/2019 Clayton Hebert   1975-07-21  [id number]  Primary Physician Clayton Arabian, MD Primary Cardiologist: Clayton Harp MD Clayton Hebert, [place]  HPI:  Clayton Hebert is a 43 y.o. 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 Marisue Hebert for cardiovascular dilation because of atypical chest pain.  He has no cardiac risk factors.  Both his maternal and paternal grandfathers had ischemic heart disease.  He does have GERD and ankylosing spondylitis.  He is never had a heart attack or stroke.  He is recently had some atypical chest pain which lasted up for over a week, on and off and resolve spontaneously.  It recurred recently.  There are no other associated symptoms.   Current Meds  Medication Sig  . famotidine (PEPCID) 10 MG tablet Take 10 mg by mouth 2 (two) times daily.  Marland Kitchen HUMIRA PEN 40 MG/0.4ML PNKT   . omeprazole (PRILOSEC) 20 MG capsule      No Known Allergies  Social History   Socioeconomic History  . Marital status: Married    Spouse name: Not on file  . Number of children: Not on file  . Years of education: Not on file  . Highest education level: Not on file  Occupational History  . Not on file  Social Needs  . Financial resource strain: Not on file  . Food insecurity    Worry: Not on file    Inability: Not on file  . Transportation needs    Medical: Not on file    Non-medical: Not on file  Tobacco Use  . Smoking status: Never Smoker  Substance and Sexual Activity  . Alcohol use: No  . Drug use: No  . Sexual activity: Not on file  Lifestyle  . Physical activity    Days per week: Not on file    Minutes per session: Not on file  . Stress: Not on file  Relationships  . Social Herbalist on phone: Not on file    Gets together: Not on file    Attends religious service: Not on file    Active member of club or organization: Not on file    Attends meetings of clubs or  organizations: Not on file    Relationship status: Not on file  . Intimate partner violence    Fear of current or ex partner: Not on file    Emotionally abused: Not on file    Physically abused: Not on file    Forced sexual activity: Not on file  Other Topics Concern  . Not on file  Social History Narrative  . Not on file     Review of Systems: General: negative for chills, fever, night sweats or weight changes.  Cardiovascular: negative for chest pain, dyspnea on exertion, edema, orthopnea, palpitations, paroxysmal nocturnal dyspnea or shortness of breath Dermatological: negative for rash Respiratory: negative for cough or wheezing Urologic: negative for hematuria Abdominal: negative for nausea, vomiting, diarrhea, bright red blood per rectum, melena, or hematemesis Neurologic: negative for visual changes, syncope, or dizziness All other systems reviewed and are otherwise negative except as noted above.    Blood pressure 114/77, pulse 74, height 5' 11"  (1.803 m), weight 201 lb (91.2 kg), SpO2 96 %.  General appearance: alert and no distress Neck: no adenopathy, no carotid bruit, no JVD, supple, symmetrical, trachea midline and thyroid not enlarged, symmetric, no tenderness/mass/nodules Lungs: clear to auscultation bilaterally  Heart: regular rate and rhythm, S1, S2 normal, no murmur, click, rub or gallop Extremities: extremities normal, atraumatic, no cyanosis or edema Pulses: 2+ and symmetric Skin: Skin color, texture, turgor normal. No rashes or lesions Neurologic: Alert and oriented X 3, normal strength and tone. Normal symmetric reflexes. Normal coordination and gait  EKG sinus rhythm at 74 without ST or T wave changes.  Personally reviewed this EKG.  ASSESSMENT AND PLAN:   Atypical chest pain Mr. Clayton Hebert was referred to me by Dr. Marisue Hebert for atypical chest pain.  He has no cardiac risk factors other than family history with paternal grandparents who had ischemic heart  disease.  He is never had a heart attack or stroke.  He has no cardiac risk factors.  Does not ankylosing spondylitis.  He gets random musculoskeletal type chest pains off and on.  He had pains a month ago were waxing and waning for a week and that the and then resolve spontaneously and again recently.  The pain does not radiate.  There are no other associated symptoms.  I am going to obtain a coronary CTA to further evaluate.      Clayton Harp MD FACP,FACC,FAHA, [facility] 02/11/2019 11:27 AM

## 2019-02-11 NOTE — Assessment & Plan Note (Signed)
Clayton Hebert was referred to me by Dr. Marisue Humble for atypical chest pain.  He has no cardiac risk factors other than family history with paternal grandparents who had ischemic heart disease.  He is never had a heart attack or stroke.  He has no cardiac risk factors.  Does not ankylosing spondylitis.  He gets random musculoskeletal type chest pains off and on.  He had pains a month ago were waxing and waning for a week and that the and then resolve spontaneously and again recently.  The pain does not radiate.  There are no other associated symptoms.  I am going to obtain a coronary CTA to further evaluate.
